# Patient Record
Sex: Male | Born: 2000 | Race: Black or African American | Hispanic: No | Marital: Single | Smoking: Light tobacco smoker
Health system: Southern US, Community
[De-identification: ages and names within clinical notes are randomized; demographics above are authoritative.]

## PROBLEM LIST (undated history)

## (undated) DIAGNOSIS — F419 Anxiety disorder, unspecified: Secondary | ICD-10-CM

---
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
## 2018-10-01 ENCOUNTER — Other Ambulatory Visit: Payer: Self-pay

## 2018-10-01 ENCOUNTER — Emergency Department (HOSPITAL_COMMUNITY): Payer: Medicaid Other

## 2018-10-01 ENCOUNTER — Encounter (HOSPITAL_COMMUNITY): Payer: Self-pay

## 2018-10-01 ENCOUNTER — Emergency Department (HOSPITAL_COMMUNITY)
Admission: EM | Admit: 2018-10-01 | Discharge: 2018-10-01 | Disposition: A | Discharge status: Home / Self Care | Payer: Medicaid Other | Attending: Emergency Medicine | Admitting: Emergency Medicine

## 2018-10-01 DIAGNOSIS — S8992XA Unspecified injury of left lower leg, initial encounter: Secondary | ICD-10-CM | POA: Diagnosis present

## 2018-10-01 DIAGNOSIS — F1210 Cannabis abuse, uncomplicated: Secondary | ICD-10-CM | POA: Insufficient documentation

## 2018-10-01 DIAGNOSIS — F17200 Nicotine dependence, unspecified, uncomplicated: Secondary | ICD-10-CM | POA: Diagnosis not present

## 2018-10-01 DIAGNOSIS — Y998 Other external cause status: Secondary | ICD-10-CM | POA: Insufficient documentation

## 2018-10-01 DIAGNOSIS — Y92410 Unspecified street and highway as the place of occurrence of the external cause: Secondary | ICD-10-CM | POA: Insufficient documentation

## 2018-10-01 DIAGNOSIS — Y9355 Activity, bike riding: Secondary | ICD-10-CM | POA: Diagnosis not present

## 2018-10-01 DIAGNOSIS — S8392XA Sprain of unspecified site of left knee, initial encounter: Secondary | ICD-10-CM | POA: Diagnosis not present

## 2018-10-01 MED ORDER — ACETAMINOPHEN 500 MG PO TABS: 1000.0000 mg | ORAL_TABLET | Freq: Once | ORAL | Status: DC

## 2018-10-01 NOTE — ED Notes (Signed)
Discharge instructions discussed with pt. Pt. verbalized understanding. No questions at this time 
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
## 2018-10-01 NOTE — Discharge Instructions (Addendum)
You can take Tylenol or Ibuprofen as directed for pain. You can alternate Tylenol and Ibuprofen every 4 hours. If you take Tylenol at 1pm, then you can take Ibuprofen at 5pm. Then you can take Tylenol again at 9pm.   Use knee immobilizer and crutches as directed.  Follow the RICE (Rest, Ice, Compression, Elevation) protocol as directed.   Return the emergency department for any worsening pain, inability to walk or bear weight, numbness/weakness, chest pain, difficulty breathing or any other worsening or concerning symptoms.
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
## 2019-02-26 ENCOUNTER — Emergency Department (HOSPITAL_COMMUNITY): Payer: Medicaid Other

## 2019-02-26 ENCOUNTER — Emergency Department (HOSPITAL_COMMUNITY)
Admission: EM | Admit: 2019-02-26 | Discharge: 2019-02-26 | Disposition: A | Discharge status: Home / Self Care | Payer: Medicaid Other | Attending: Emergency Medicine | Admitting: Emergency Medicine

## 2019-02-26 DIAGNOSIS — F17210 Nicotine dependence, cigarettes, uncomplicated: Secondary | ICD-10-CM | POA: Diagnosis not present

## 2019-02-26 DIAGNOSIS — S0005XA Superficial foreign body of scalp, initial encounter: Secondary | ICD-10-CM

## 2019-02-26 DIAGNOSIS — S0990XA Unspecified injury of head, initial encounter: Secondary | ICD-10-CM | POA: Diagnosis not present

## 2019-02-26 DIAGNOSIS — Y999 Unspecified external cause status: Secondary | ICD-10-CM | POA: Diagnosis not present

## 2019-02-26 DIAGNOSIS — Y92410 Unspecified street and highway as the place of occurrence of the external cause: Secondary | ICD-10-CM | POA: Insufficient documentation

## 2019-02-26 DIAGNOSIS — S0101XA Laceration without foreign body of scalp, initial encounter: Secondary | ICD-10-CM

## 2019-02-26 DIAGNOSIS — S0102XA Laceration with foreign body of scalp, initial encounter: Secondary | ICD-10-CM | POA: Diagnosis not present

## 2019-02-26 DIAGNOSIS — Y93I9 Activity, other involving external motion: Secondary | ICD-10-CM | POA: Diagnosis not present

## 2019-02-26 MED ORDER — BACITRACIN ZINC 500 UNIT/GM EX OINT: TOPICAL_OINTMENT | Freq: Once | CUTANEOUS | Status: AC

## 2019-02-26 MED ORDER — TETANUS-DIPHTH-ACELL PERTUSSIS 5-2.5-18.5 LF-MCG/0.5 IM SUSP: 0.5000 mL | Freq: Once | INTRAMUSCULAR | Status: DC
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
## 2019-02-26 NOTE — Discharge Instructions (Signed)
Take acetaminophen or ibuprofen as needed for pain.  Return if you are having any problems.

## 2019-02-26 NOTE — ED Notes (Signed)
Returned from CT. This patient insisted on walking himself to the bathroom, unable to re-direct patient multiple times.
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
## 2022-11-21 DEATH — deceased
